# Patient Record
Sex: Female | Born: 1990 | Race: White | Hispanic: Yes | Marital: Married | Smoking: Never smoker
Health system: Southern US, Community
[De-identification: ages and names within clinical notes are randomized; demographics above are authoritative.]

## PROBLEM LIST (undated history)

## (undated) DIAGNOSIS — R112 Nausea with vomiting, unspecified: Secondary | ICD-10-CM

## (undated) HISTORY — DX: Nausea with vomiting, unspecified: R11.2

## (undated) HISTORY — PX: OTHER SURGICAL HISTORY: SHX169

---

## 2018-11-15 DIAGNOSIS — Z3481 Encounter for supervision of other normal pregnancy, first trimester: Secondary | ICD-10-CM | POA: Diagnosis not present

## 2018-11-15 DIAGNOSIS — Z1388 Encounter for screening for disorder due to exposure to contaminants: Secondary | ICD-10-CM | POA: Diagnosis not present

## 2018-11-15 DIAGNOSIS — Z3480 Encounter for supervision of other normal pregnancy, unspecified trimester: Secondary | ICD-10-CM | POA: Diagnosis not present

## 2018-11-15 DIAGNOSIS — Z3009 Encounter for other general counseling and advice on contraception: Secondary | ICD-10-CM | POA: Diagnosis not present

## 2018-11-15 DIAGNOSIS — Z0389 Encounter for observation for other suspected diseases and conditions ruled out: Secondary | ICD-10-CM | POA: Diagnosis not present

## 2018-11-16 LAB — OB RESULTS CONSOLE HIV ANTIBODY (ROUTINE TESTING): HIV: NONREACTIVE

## 2018-11-16 LAB — OB RESULTS CONSOLE ANTIBODY SCREEN: Antibody Screen: NEGATIVE

## 2018-11-16 LAB — OB RESULTS CONSOLE RUBELLA ANTIBODY, IGM: Rubella: IMMUNE

## 2018-11-16 LAB — OB RESULTS CONSOLE HGB/HCT, BLOOD
HCT: 37 (ref 29–41)
Hemoglobin: 12.3

## 2018-11-16 LAB — OB RESULTS CONSOLE ABO/RH
ABO/RH(D): A POS
RH Type: POSITIVE

## 2018-11-16 LAB — OB RESULTS CONSOLE GC/CHLAMYDIA
Chlamydia: NEGATIVE
Gonorrhea: NEGATIVE

## 2018-11-16 LAB — OB RESULTS CONSOLE RPR: RPR: NONREACTIVE

## 2018-11-16 LAB — SICKLE CELL SCREEN: Sickle Cell Screen: NORMAL

## 2018-11-16 LAB — OB RESULTS CONSOLE HEPATITIS B SURFACE ANTIGEN: Hepatitis B Surface Ag: NEGATIVE

## 2018-11-16 LAB — OB RESULTS CONSOLE PLATELET COUNT: Platelets: 351000

## 2018-11-16 LAB — OB RESULTS CONSOLE VARICELLA ZOSTER ANTIBODY, IGG: Varicella: IMMUNE

## 2018-11-18 DIAGNOSIS — Z3A12 12 weeks gestation of pregnancy: Secondary | ICD-10-CM | POA: Diagnosis not present

## 2018-11-18 DIAGNOSIS — O3680X0 Pregnancy with inconclusive fetal viability, not applicable or unspecified: Secondary | ICD-10-CM | POA: Diagnosis not present

## 2018-12-13 ENCOUNTER — Other Ambulatory Visit: Payer: Self-pay

## 2018-12-13 ENCOUNTER — Ambulatory Visit: Payer: Medicaid Other | Admitting: Nurse Practitioner

## 2018-12-13 DIAGNOSIS — O219 Vomiting of pregnancy, unspecified: Secondary | ICD-10-CM | POA: Insufficient documentation

## 2018-12-13 DIAGNOSIS — Z3482 Encounter for supervision of other normal pregnancy, second trimester: Secondary | ICD-10-CM

## 2018-12-13 DIAGNOSIS — Z6790 Unspecified blood type, Rh positive: Secondary | ICD-10-CM | POA: Insufficient documentation

## 2018-12-13 DIAGNOSIS — Z3480 Encounter for supervision of other normal pregnancy, unspecified trimester: Secondary | ICD-10-CM | POA: Insufficient documentation

## 2018-12-13 NOTE — Progress Notes (Signed)
Connected with patient by phone for scheduled Telehealth appt. Identified patient and verified current location as home address. Patient verbalized she is comfortable discussing health information by phone in current location. Taking PNV QD, denies ED/hospital visits since last RV. Explained optional Quad screen test today, patient declines Quad screen at this time. Patient aware of scheduled Anatomy ultrasound at [facility] for 01/06/2019 @ 3:15. Patient took BP with home BP cuff. Does not have scales at home to assess weight. Patient complaining of intermittent vaginal pressure. Denies burning, stinging, urgency or pain with urination. Reviewed with patient next appt will be in clinic. Will schedule patient for 4 week follow up appt in Maternity before provider completes call.Marland Kitchen

## 2018-12-13 NOTE — Progress Notes (Signed)
   TELEPHONE OBSTETRICS VISIT ENCOUNTER NOTE  I connected with@ on 12/13/18 at  1:35 PM EDT by telephone at home and verified that I am speaking with the correct person using two identifiers.   I discussed the limitations, risks, security and privacy concerns of performing an evaluation and management service by telephone and the availability of in person appointments. I also discussed with the patient that there may be a patient responsible charge related to this service. The patient expressed understanding and agreed to proceed.  Subjective:  Holly Hunter is a 28 y.o. G1P0 at Unknown being followed for ongoing prenatal care.  She is currently monitored for the following issues for this low-risk pregnancy and does not have a problem list on file.  Patient reports pelvic pressure, hot flashes and intermittent dizziness.  Client c/o intermittent pelvic pressure - plans for follow up in clinic if symptoms persists or worsen At night - hot flashes - advised of keeping sleeping area as cool as possible - noted symptoms my be related to adjusting hormone levels during pregnancy Dizziness - states that she had dizziness on 12/12/2018 for a few minutes and resolved - advised that during next visit BP would be monitored, advised to increase water intake to 8 glasses daily and plan to "look in client's ears" during next visit - possible cerumen impaction.  Reports fetal movement. Denies any contractions, bleeding or leaking of fluid.  Client verbalizes understanding and is in agreement with plan of care   The following portions of the patient's history were reviewed and updated as appropriate: allergies, current medications, past family history, past medical history, past social history, past surgical history and problem list.   Objective:   General:  Alert, oriented and cooperative.   Mental Status: Normal mood and affect perceived. Normal judgment and thought content.  Rest of physical exam  deferred due to type of encounter  Assessment and Plan:  Pregnancy: G1P0 at Unknown There are no diagnoses linked to this encounter. Preterm labor symptoms and general obstetric precautions including but not limited to vaginal bleeding, contractions, leaking of fluid and fetal movement were reviewed in detail with the patient.  I discussed the assessment and treatment plan with the patient. The patient was provided an opportunity to ask questions and all were answered. The patient agreed with the plan and demonstrated an understanding of the instructions. The patient was advised to call back or seek an in-person office evaluation/go to the hospital for any urgent or concerning symptoms.  Please refer to After Visit Summary for other counseling recommendations.   I provided 8 minutes of non-face-to-face time during this encounter.  No follow-ups on file.  No future appointments.  Berniece Andreas, NP

## 2019-01-06 DIAGNOSIS — Z36 Encounter for antenatal screening for chromosomal anomalies: Secondary | ICD-10-CM | POA: Diagnosis not present

## 2019-01-06 DIAGNOSIS — Z3A19 19 weeks gestation of pregnancy: Secondary | ICD-10-CM | POA: Diagnosis not present

## 2019-01-06 DIAGNOSIS — Z363 Encounter for antenatal screening for malformations: Secondary | ICD-10-CM | POA: Diagnosis not present

## 2019-01-10 ENCOUNTER — Ambulatory Visit: Payer: Medicaid Other | Admitting: Nurse Practitioner

## 2019-01-10 ENCOUNTER — Other Ambulatory Visit: Payer: Self-pay

## 2019-01-10 VITALS — BP 116/72 | Temp 97.4°F | Wt 136.4 lb

## 2019-01-10 DIAGNOSIS — Z3482 Encounter for supervision of other normal pregnancy, second trimester: Secondary | ICD-10-CM

## 2019-01-10 NOTE — Progress Notes (Signed)
   PRENATAL VISIT NOTE  Subjective:  Holly Hunter is a 28 y.o. G2P1001 at [redacted]w[redacted]d being seen today for ongoing prenatal care.  She is currently monitored for the following issues for this low-risk pregnancy and has Prenatal care, subsequent pregnancy, second trimester; Blood type, Rh positive; and Nausea and vomiting in pregnancy on their problem list.  Patient reports no complaints.  Contractions: Not present. Vag. Bleeding: None.  Movement: Present. Denies leaking of fluid/ROM.   The following portions of the patient's history were reviewed and updated as appropriate: allergies, current medications, past family history, past medical history, past social history, past surgical history and problem list. Problem list updated.  Objective:   Vitals:   01/10/19 1115  BP: 116/72  Temp: (!) 97.4 F (36.3 C)  Weight: 136 lb 6.4 oz (61.9 kg)    Fetal Status: Fetal Heart Rate (bpm): 150 Fundal Height: 20 cm Movement: Present     General:  Alert, oriented and cooperative. Patient is in no acute distress.  Skin: Skin is warm and dry. No rash noted.   Cardiovascular: Normal heart rate noted  Respiratory: Normal respiratory effort, no problems with respiration noted  Abdomen: Soft, gravid, appropriate for gestational age.  Pain/Pressure: Absent     Pelvic: Cervical exam deferred        Extremities: Normal range of motion.  Edema: None  Mental Status: Normal mood and affect. Normal behavior. Normal judgment and thought content.   Assessment and Plan:  Pregnancy: G2P1001 at [redacted]w[redacted]d  1. Prenatal care, subsequent pregnancy, second trimester Client doing well -  Denies any complaints at this time Discussed donation of placenta after delivery Discussed "growing pains" Daughter present     Preterm labor symptoms and general obstetric precautions including but not limited to vaginal bleeding, contractions, leaking of fluid and fetal movement were reviewed in detail with the patient. Please  refer to After Visit Summary for other counseling recommendations.  Return in about 4 weeks (around 02/07/2019) for routine prenatal care.  Future Appointments  Date Time Provider [place]  02/07/2019 10:20 AM [facility] PROVIDER AC[facility] None    Berniece Andreas, NP

## 2019-01-10 NOTE — Progress Notes (Signed)
Denies s/s or exposure to Covid-19. Taking PNV and [place] QD, denies ED/hospital visits since last RV. Kept 01/06/19 [facility] anatomy scan appt. Hal Morales, RN

## 2019-01-18 NOTE — Progress Notes (Signed)
Updated [place] information - no further action required at this time

## 2019-02-07 ENCOUNTER — Ambulatory Visit: Payer: Medicaid Other | Admitting: Advanced Practice Midwife

## 2019-02-07 ENCOUNTER — Other Ambulatory Visit: Payer: Self-pay

## 2019-02-07 ENCOUNTER — Encounter: Payer: Self-pay | Admitting: Advanced Practice Midwife

## 2019-02-07 ENCOUNTER — Ambulatory Visit: Payer: Medicaid Other

## 2019-02-07 DIAGNOSIS — Z3482 Encounter for supervision of other normal pregnancy, second trimester: Secondary | ICD-10-CM

## 2019-02-07 NOTE — Progress Notes (Signed)
   TELEPHONE OBSTETRICS VISIT ENCOUNTER NOTE  I connected with [name] @ on 02/07/19 at 10:20 AM EDT by telephone at home and verified that I am speaking with the correct person using two identifiers.   I discussed the limitations, risks, security and privacy concerns of performing an evaluation and management service by telephone and the availability of in person appointments. I also discussed with the patient that there may be a patient responsible charge related to this service. The patient expressed understanding and agreed to proceed.  Subjective:  Jocilyn Trego is a 28 y.o. G2P1001 at [redacted]w[redacted]d being followed for ongoing prenatal care.  She is currently monitored for the following issues for this low-risk pregnancy and has Prenatal care, subsequent pregnancy, second trimester; Blood type, Rh positive; and Nausea and vomiting in pregnancy on their problem list.  Patient reports heartburn. Reports fetal movement. Denies any contractions, bleeding or leaking of fluid.   The following portions of the patient's history were reviewed and updated as appropriate: allergies, current medications, past family history, past medical history, past social history, past surgical history and problem list.   Objective:   General:  Alert, oriented and cooperative.   Mental Status: Normal mood and affect perceived. Normal judgment and thought content.  Rest of physical exam deferred due to type of encounter  Assessment and Plan:  Pregnancy: G2P1001 at [redacted]w[redacted]d 1. Prenatal care, subsequent pregnancy, second trimester Feels well except occassional "heaviness" in lower abdomen and GERD with greasy burgers--suggestions given. Preterm labor symptoms and general obstetric precautions including but not limited to vaginal bleeding, contractions, leaking of fluid and fetal movement were reviewed in detail with the patient.  I discussed the assessment and treatment plan with the patient. The patient was  provided an opportunity to ask questions and all were answered. The patient agreed with the plan and demonstrated an understanding of the instructions. The patient was advised to call back or seek an in-person office evaluation/go to the hospital for any urgent or concerning symptoms.  Please refer to After Visit Summary for other counseling recommendations.   I provided 8 minutes of non-face-to-face time during this encounter.  Return for routine PNC and 28 wk labs.  Future Appointments  Date Time Provider [place]  03/07/2019  1:20 PM AC-MH PROVIDER AC-[facility] None    Herbie Saxon, CNM

## 2019-02-07 NOTE — Progress Notes (Signed)
Call fot [place] Telehealth visit; confirmed identity; BP recorded per client; discussed labs needed @ next visit; taking PNV Debera Lat, RN

## 2019-03-07 ENCOUNTER — Ambulatory Visit: Payer: Medicaid Other | Admitting: Advanced Practice Midwife

## 2019-03-07 ENCOUNTER — Other Ambulatory Visit: Payer: Self-pay

## 2019-03-07 VITALS — BP 114/70 | Temp 98.7°F | Wt 145.8 lb

## 2019-03-07 DIAGNOSIS — Z1388 Encounter for screening for disorder due to exposure to contaminants: Secondary | ICD-10-CM | POA: Diagnosis not present

## 2019-03-07 DIAGNOSIS — Z3480 Encounter for supervision of other normal pregnancy, unspecified trimester: Secondary | ICD-10-CM

## 2019-03-07 DIAGNOSIS — Z23 Encounter for immunization: Secondary | ICD-10-CM | POA: Diagnosis not present

## 2019-03-07 DIAGNOSIS — Z3009 Encounter for other general counseling and advice on contraception: Secondary | ICD-10-CM | POA: Diagnosis not present

## 2019-03-07 DIAGNOSIS — Z3482 Encounter for supervision of other normal pregnancy, second trimester: Secondary | ICD-10-CM | POA: Diagnosis not present

## 2019-03-07 DIAGNOSIS — Z0389 Encounter for observation for other suspected diseases and conditions ruled out: Secondary | ICD-10-CM | POA: Diagnosis not present

## 2019-03-07 DIAGNOSIS — Z6790 Unspecified blood type, Rh positive: Secondary | ICD-10-CM

## 2019-03-07 LAB — HEMOGLOBIN, FINGERSTICK: Hemoglobin: 11.1 g/dL (ref 11.1–15.9)

## 2019-03-07 LAB — HIV ANTIBODY (ROUTINE TESTING W REFLEX): HIV 1&2 Ab, 4th Generation: NONREACTIVE

## 2019-03-07 NOTE — Progress Notes (Signed)
Negative responses to Covid-19 screening questions and denies international travel for self or FOB since pregnant. Taking PNV QD. 28 week labs today. Verified has [facility] contact card. Rich Number, RN  Hgb = 11.1 and no interventions required per standing order.  Rich Number, RN

## 2019-03-07 NOTE — Progress Notes (Signed)
   PRENATAL VISIT NOTE  Subjective:  Holly Hunter is a 28 y.o. G2P1001 at [redacted]w[redacted]d being seen today for ongoing prenatal care.  She is currently monitored for the following issues for this low-risk pregnancy and has Prenatal care, subsequent pregnancy, second trimester; Blood type, Rh positive; and Nausea and vomiting in pregnancy on their problem list.  Patient reports sciatica.  Contractions: Not present. Vag. Bleeding: None.  Movement: Present. Denies leaking of fluid/ROM.   The following portions of the patient's history were reviewed and updated as appropriate: allergies, current medications, past family history, past medical history, past social history, past surgical history and problem list. Problem list updated.  Objective:   Vitals:   03/07/19 1312  BP: 114/70  Temp: 98.7 F (37.1 C)  Weight: 145 lb 12.8 oz (66.1 kg)    Fetal Status: Fetal Heart Rate (bpm): 144 Fundal Height: 29 cm Movement: Present  Presentation: Complete Breech  General:  Alert, oriented and cooperative. Patient is in no acute distress.  Skin: Skin is warm and dry. No rash noted.   Cardiovascular: Normal heart rate noted  Respiratory: Normal respiratory effort, no problems with respiration noted  Abdomen: Soft, gravid, appropriate for gestational age.  Pain/Pressure: Absent     Pelvic: Cervical exam deferred        Extremities: Normal range of motion.  Edema: None  Mental Status: Normal mood and affect. Normal behavior. Normal judgment and thought content.   Assessment and Plan:  Pregnancy: G2P1001 at [redacted]w[redacted]d   1. Blood type, Rh positive .  2. Supervision of other normal pregnancy, antepartum C/o right sciatica--suggestions given - HIV [place] [facility] - RPR - Glucose, 1 hour gestational - Tdap vaccine greater than or equal to 7yo IM - Flu Vaccine QUAD 36+ mos IM - Hemoglobin, venipuncture   Preterm labor symptoms and general obstetric precautions including but not limited to vaginal bleeding,  contractions, leaking of fluid and fetal movement were reviewed in detail with the patient. Please refer to After Visit Summary for other counseling recommendations.  No follow-ups on file.  No future appointments.  Herbie Saxon, CNM

## 2019-03-08 LAB — RPR: RPR Ser Ql: NONREACTIVE

## 2019-03-08 LAB — GLUCOSE, 1 HOUR GESTATIONAL: Gestational Diabetes Screen: 93 mg/dL (ref 65–139)

## 2019-03-21 ENCOUNTER — Other Ambulatory Visit: Payer: Self-pay

## 2019-03-21 ENCOUNTER — Ambulatory Visit: Payer: Medicaid Other | Admitting: Advanced Practice Midwife

## 2019-03-21 DIAGNOSIS — Z3482 Encounter for supervision of other normal pregnancy, second trimester: Secondary | ICD-10-CM

## 2019-03-21 NOTE — Progress Notes (Signed)
   TELEPHONE OBSTETRICS VISIT ENCOUNTER NOTE  I connected with Holly Hunter @ on 03/21/19 at  3:40 PM EDT by telephone at home and verified that I am speaking with the correct person using two identifiers.   I discussed the limitations, risks, security and privacy concerns of performing an evaluation and management service by telephone and the availability of in person appointments. I also discussed with the patient that there may be a patient responsible charge related to this service. The patient expressed understanding and agreed to proceed.  Subjective:  Holly Hunter is a 28 y.o. G2P1001 at [redacted]w[redacted]d being followed for ongoing prenatal care.  She is currently monitored for the following issues for this low-risk pregnancy and has Prenatal care, subsequent pregnancy, second trimester; Blood type, Rh positive; and Nausea and vomiting in pregnancy on their problem list.  Patient reports abdominal pressure. Reports fetal movement. Denies any contractions, bleeding or leaking of fluid.   The following portions of the patient's history were reviewed and updated as appropriate: allergies, current medications, past family history, past medical history, past social history, past surgical history and problem list.   Objective:   General:  Alert, oriented and cooperative.   Mental Status: Normal mood and affect perceived. Normal judgment and thought content.  Rest of physical exam deferred due to type of encounter  Assessment and Plan:  Pregnancy: G2P1001 at [redacted]w[redacted]d 1. Prenatal care, subsequent pregnancy, second trimester C/o low abdominal pressure at end of day--suggestions given Reviewed  11/18/18 u/s at 12 6/7 wks with posterior placenta and EDC=05/27/19  Preterm labor symptoms and general obstetric precautions including but not limited to vaginal bleeding, contractions, leaking of fluid and fetal movement were reviewed in detail with the patient.  I discussed the assessment and treatment plan  with the patient. The patient was provided an opportunity to ask questions and all were answered. The patient agreed with the plan and demonstrated an understanding of the instructions. The patient was advised to call back or seek an in-person office evaluation/go to the hospital for any urgent or concerning symptoms.  Please refer to After Visit Summary for other counseling recommendations.   I provided 7 minutes of non-face-to-face time during this encounter.  No follow-ups on file.  Future Appointments  Date Time Provider [place]  04/04/2019  3:20 PM [facility] PROVIDER [facility] None    Holly Hunter, [facility]

## 2019-03-21 NOTE — Progress Notes (Signed)
Phone call to patient for 30.3 week Telehealth appt. Identified patient and current location as home address. Patient states she is ok with discussing [place] over the phone at current location. Patient answers no to all Covid-19 questions. Taking PNV QD. Denies ED/hospital visits since last RV. BP taken with home BP cuff. Patient states she does no have scales to assess weight. 2 wk MH RV scheduled for 04/04/2019 @ 3:20. Instructed patient to arrive at 3:00 for check in also instructed patient to remain at this same contact number for provider to call back and complete provider portion of visit. Patient agreeable to same. Hal Morales, RN

## 2019-04-04 ENCOUNTER — Other Ambulatory Visit: Payer: Self-pay

## 2019-04-04 ENCOUNTER — Ambulatory Visit: Payer: Medicaid Other | Admitting: Advanced Practice Midwife

## 2019-04-04 DIAGNOSIS — Z3482 Encounter for supervision of other normal pregnancy, second trimester: Secondary | ICD-10-CM

## 2019-04-04 NOTE — Progress Notes (Signed)
   PRENATAL VISIT NOTE  Subjective:  Holly Hunter is a 28 y.o. G2P1001 at [redacted]w[redacted]d being seen today for ongoing prenatal care.  She is currently monitored for the following issues for this low-risk pregnancy and has Prenatal care, subsequent pregnancy, second trimester; Blood type, Rh positive; and Nausea and vomiting in pregnancy on their problem list.  Patient reports no complaints.  Contractions: Not present. Vag. Bleeding: None.  Movement: Present. Denies leaking of fluid/ROM.   The following portions of the patient's history were reviewed and updated as appropriate: allergies, current medications, past family history, past medical history, past social history, past surgical history and problem list. Problem list updated.  Objective:   Vitals:   04/04/19 1512  BP: 107/65  Temp: 98.3 F (36.8 C)  Weight: 151 lb 12.8 oz (68.9 kg)    Fetal Status: Fetal Heart Rate (bpm): 160 Fundal Height: 33 cm Movement: Present     General:  Alert, oriented and cooperative. Patient is in no acute distress.  Skin: Skin is warm and dry. No rash noted.   Cardiovascular: Normal heart rate noted  Respiratory: Normal respiratory effort, no problems with respiration noted  Abdomen: Soft, gravid, appropriate for gestational age.  Pain/Pressure: Absent     Pelvic: Cervical exam deferred        Extremities: Normal range of motion.  Edema: None  Mental Status: Normal mood and affect. Normal behavior. Normal judgment and thought content.   Assessment and Plan:  Pregnancy: G2P1001 at [redacted]w[redacted]d  1. Prenatal care, subsequent pregnancy, second trimester C/o vaginal pressure occasionally--suggestions given Has car seat.  Wants ocp's pp for birth control. Reviewed 01/06/19 u/s at 20 2/7 wks with post placenta, 3VC, normal anatomy   Preterm labor symptoms and general obstetric precautions including but not limited to vaginal bleeding, contractions, leaking of fluid and fetal movement were reviewed in detail with  the patient. Please refer to After Visit Summary for other counseling recommendations.  Return in about 2 weeks (around 04/18/2019) for routine PNC.  No future appointments.  Herbie Saxon, CNM

## 2019-04-04 NOTE — Progress Notes (Signed)
Patient here for MH RV at 32 3/7. Kick counts reviewed, and cards given..Waldine Zenz Brewer-Jensen, RN 

## 2019-04-18 ENCOUNTER — Other Ambulatory Visit: Payer: Self-pay

## 2019-04-18 ENCOUNTER — Ambulatory Visit: Payer: Medicaid Other | Admitting: Advanced Practice Midwife

## 2019-04-18 DIAGNOSIS — Z6790 Unspecified blood type, Rh positive: Secondary | ICD-10-CM

## 2019-04-18 DIAGNOSIS — Z3482 Encounter for supervision of other normal pregnancy, second trimester: Secondary | ICD-10-CM

## 2019-04-18 NOTE — Progress Notes (Addendum)
Patient here for MH RV at 34 3/7. Patient has some questions about starting with breastfeeding and switching to formula when she returns to work .Jenetta Downer, RN

## 2019-04-18 NOTE — Progress Notes (Signed)
   PRENATAL VISIT NOTE  Subjective:  Holly Hunter is a 28 y.o. G2P1001 at [redacted]w[redacted]d being seen today for ongoing prenatal care.  She is currently monitored for the following issues for this low-risk pregnancy and has Prenatal care, subsequent pregnancy, second trimester; Blood type, Rh positive; and Nausea and vomiting in pregnancy on their problem list.  Patient reports no complaints.  Contractions: Not present. Vag. Bleeding: None.  Movement: Present. Denies leaking of fluid/ROM.   The following portions of the patient's history were reviewed and updated as appropriate: allergies, current medications, past family history, past medical history, past social history, past surgical history and problem list. Problem list updated.  Objective:   Vitals:   04/18/19 1500  BP: 101/62  Temp: 98.6 F (37 C)  Weight: 153 lb 6.4 oz (69.6 kg)    Fetal Status: Fetal Heart Rate (bpm): 140 Fundal Height: 35 cm Movement: Present     General:  Alert, oriented and cooperative. Patient is in no acute distress.  Skin: Skin is warm and dry. No rash noted.   Cardiovascular: Normal heart rate noted  Respiratory: Normal respiratory effort, no problems with respiration noted  Abdomen: Soft, gravid, appropriate for gestational age.  Pain/Pressure: Absent     Pelvic: Cervical exam deferred        Extremities: Normal range of motion.  Edema: None  Mental Status: Normal mood and affect. Normal behavior. Normal judgment and thought content.   Assessment and Plan:  Pregnancy: G2P1001 at [redacted]w[redacted]d  1. Prenatal care, subsequent pregnancy, second trimester Stopped working in May or June and is on maternity leave.  Breastfeeding questions answered as well as transitioning to work and pumping.   Preterm labor symptoms and general obstetric precautions including but not limited to vaginal bleeding, contractions, leaking of fluid and fetal movement were reviewed in detail with the patient. Please refer to After Visit  Summary for other counseling recommendations.  No follow-ups on file.  Future Appointments  Date Time Provider [place]  05/02/2019  3:00 PM [facility] PROVIDER [facility] None    Herbie Saxon, CNM

## 2019-05-02 ENCOUNTER — Ambulatory Visit: Payer: Medicaid Other | Admitting: Advanced Practice Midwife

## 2019-05-02 ENCOUNTER — Other Ambulatory Visit: Payer: Self-pay

## 2019-05-02 DIAGNOSIS — Z3482 Encounter for supervision of other normal pregnancy, second trimester: Secondary | ICD-10-CM | POA: Diagnosis not present

## 2019-05-02 NOTE — Progress Notes (Signed)
   PRENATAL VISIT NOTE  Subjective:  Holly Hunter is a 28 y.o. G2P1001 at [redacted]w[redacted]d being seen today for ongoing prenatal care.  She is currently monitored for the following issues for this low-risk pregnancy and has Prenatal care, subsequent pregnancy, second trimester; Blood type, Rh positive; and Nausea and vomiting in pregnancy on their problem list.  Patient reports increased vaginal pressure at night.  Contractions: Not present. Vag. Bleeding: None.  Movement: Present. Denies leaking of fluid/ROM.   The following portions of the patient's history were reviewed and updated as appropriate: allergies, current medications, past family history, past medical history, past social history, past surgical history and problem list. Problem list updated.  Objective:   Vitals:   05/02/19 1459  BP: 102/72  Temp: 97.7 F (36.5 C)  Weight: 155 lb (70.3 kg)    Fetal Status: Fetal Heart Rate (bpm): 160 Fundal Height: 35 cm Movement: Present  Presentation: Vertex  General:  Alert, oriented and cooperative. Patient is in no acute distress.  Skin: Skin is warm and dry. No rash noted.   Cardiovascular: Normal heart rate noted  Respiratory: Normal respiratory effort, no problems with respiration noted  Abdomen: Soft, gravid, appropriate for gestational age.  Pain/Pressure: Absent     Pelvic: Cervical exam deferred        Extremities: Normal range of motion.  Edema: None  Mental Status: Normal mood and affect. Normal behavior. Normal judgment and thought content.   Assessment and Plan:  Pregnancy: G2P1001 at [redacted]w[redacted]d  1. Prenatal care, subsequent pregnancy, second trimester Knows when to go to L&D.  Ready for baby at home.  On maternity leave.  GC/Chlamydia/GBS cultures done   Preterm labor symptoms and general obstetric precautions including but not limited to vaginal bleeding, contractions, leaking of fluid and fetal movement were reviewed in detail with the patient. Please refer to After Visit  Summary for other counseling recommendations.  No follow-ups on file.  No future appointments.  Herbie Saxon, CNM

## 2019-05-05 LAB — CHLAMYDIA/GC NAA, CONFIRMATION
Chlamydia trachomatis, NAA: NEGATIVE
Neisseria gonorrhoeae, NAA: NEGATIVE

## 2019-05-06 LAB — CULTURE, BETA STREP (GROUP B ONLY): Strep Gp B Culture: NEGATIVE

## 2019-05-09 ENCOUNTER — Ambulatory Visit: Payer: Medicaid Other | Admitting: Advanced Practice Midwife

## 2019-05-09 ENCOUNTER — Other Ambulatory Visit: Payer: Self-pay

## 2019-05-09 DIAGNOSIS — Z3482 Encounter for supervision of other normal pregnancy, second trimester: Secondary | ICD-10-CM

## 2019-05-09 NOTE — Progress Notes (Signed)
   PRENATAL VISIT NOTE  Subjective:  Holly Hunter is a 28 y.o. G2P1001 at [redacted]w[redacted]d being seen today for ongoing prenatal care.  She is currently monitored for the following issues for this low-risk pregnancy and has Prenatal care, subsequent pregnancy, second trimester; Blood type, Rh positive; and Nausea and vomiting in pregnancy on their problem list.  Patient reports no complaints.  Contractions: Not present. Vag. Bleeding: None.  Movement: Present.  Denies leaking of fluid/ROM.   The following portions of the patient's history were reviewed and updated as appropriate: allergies, current medications, past family history, past medical history, past social history, past surgical history and problem list. Problem list updated.  Objective:   Vitals:   05/09/19 1020  BP: 114/75  Temp: (!) 97.4 F (36.3 C)  Weight: 156 lb (70.8 kg)    Fetal Status: Fetal Heart Rate (bpm): 130 Fundal Height: 37 cm Movement: Present  Presentation: Vertex  General:  Alert, oriented and cooperative. Patient is in no acute distress.  Skin: Skin is warm and dry. No rash noted.   Cardiovascular: Normal heart rate noted  Respiratory: Normal respiratory effort, no problems with respiration noted  Abdomen: Soft, gravid, appropriate for gestational age.  Pain/Pressure: Absent     Pelvic: Cervical exam deferred        Extremities: Normal range of motion.  Edema: None  Mental Status: Normal mood and affect. Normal behavior. Normal judgment and thought content.   Assessment and Plan:  Pregnancy: G2P1001 at [redacted]w[redacted]d  1. Prenatal care, subsequent pregnancy, second trimester Feels well.  Not working.  Ready for baby at home.  Knows when to go to L&D   Preterm labor symptoms and general obstetric precautions including but not limited to vaginal bleeding, contractions, leaking of fluid and fetal movement were reviewed in detail with the patient. Please refer to After Visit Summary for other counseling  recommendations.  Return in about 1 week (around 05/16/2019) for routine PNC.  No future appointments.  Herbie Saxon, CNM

## 2019-05-09 NOTE — Progress Notes (Signed)
Here today for 37.3 week MH RV.   Taking PNV QD, denies ED/hospital visits since last RV. Semir Brill, RN   

## 2019-05-17 ENCOUNTER — Other Ambulatory Visit: Payer: Self-pay

## 2019-05-17 ENCOUNTER — Ambulatory Visit: Payer: Medicaid Other | Admitting: Advanced Practice Midwife

## 2019-05-17 DIAGNOSIS — Z3483 Encounter for supervision of other normal pregnancy, third trimester: Secondary | ICD-10-CM

## 2019-05-17 NOTE — Progress Notes (Signed)
Here today for 38.4 week MH RV. Taking PNV QD, denies ED/hospital visits since last RV. Hal Morales, RN

## 2019-05-17 NOTE — Progress Notes (Signed)
   PRENATAL VISIT NOTE  Subjective:  Holly Hunter is a 28 y.o. G2P1001 at [redacted]w[redacted]d being seen today for ongoing prenatal care.  She is currently monitored for the following issues for this low-risk pregnancy and has Prenatal care, subsequent pregnancy, second trimester; Blood type, Rh positive; Nausea and vomiting in pregnancy; and Prenatal care, subsequent pregnancy, third trimester on their problem list.  Patient reports no complaints.  Contractions: Not present. Vag. Bleeding: None.  Movement: Present. Denies leaking of fluid/ROM.   The following portions of the patient's history were reviewed and updated as appropriate: allergies, current medications, past family history, past medical history, past social history, past surgical history and problem list. Problem list updated.  Objective:   Vitals:   05/17/19 1006  BP: 111/70  Temp: (!) 97.3 F (36.3 C)  Weight: 158 lb 3.2 oz (71.8 kg)    Fetal Status: Fetal Heart Rate (bpm): 150 Fundal Height: 38 cm Movement: Present  Presentation: Vertex  General:  Alert, oriented and cooperative. Patient is in no acute distress.  Skin: Skin is warm and dry. No rash noted.   Cardiovascular: Normal heart rate noted  Respiratory: Normal respiratory effort, no problems with respiration noted  Abdomen: Soft, gravid, appropriate for gestational age.  Pain/Pressure: Absent     Pelvic: Cervical exam deferred        Extremities: Normal range of motion.  Edema: None  Mental Status: Normal mood and affect. Normal behavior. Normal judgment and thought content.   Assessment and Plan:  Pregnancy: G2P1001 at [redacted]w[redacted]d  1. Prenatal care, subsequent pregnancy, third trimester Feels well.  Ready for baby at home.  Knows when to go to L&D.  Has car seat.  EFW=7#   Term labor symptoms and general obstetric precautions including but not limited to vaginal bleeding, contractions, leaking of fluid and fetal movement were reviewed in detail with the patient. Please  refer to After Visit Summary for other counseling recommendations.  Return in about 1 week (around 05/24/2019) for routine PNC.  No future appointments.  Herbie Saxon, CNM

## 2019-05-19 ENCOUNTER — Encounter: Payer: Self-pay | Admitting: Family Medicine

## 2019-05-24 ENCOUNTER — Ambulatory Visit: Payer: Medicaid Other | Admitting: Advanced Practice Midwife

## 2019-05-24 ENCOUNTER — Other Ambulatory Visit: Payer: Self-pay

## 2019-05-24 DIAGNOSIS — Z3480 Encounter for supervision of other normal pregnancy, unspecified trimester: Secondary | ICD-10-CM

## 2019-05-24 DIAGNOSIS — Z6790 Unspecified blood type, Rh positive: Secondary | ICD-10-CM

## 2019-05-24 NOTE — Progress Notes (Signed)
Patient here for MH RV at 39 4/7. .Jaizon Deroos Brewer-Jensen, RN  

## 2019-05-24 NOTE — Progress Notes (Signed)
   PRENATAL VISIT NOTE  Subjective:  Holly Hunter is a 28 y.o. G2P1001 at [redacted]w[redacted]d being seen today for ongoing prenatal care.  She is currently monitored for the following issues for this low-risk pregnancy and has Supervision of other normal pregnancy, antepartum; Blood type, Rh positive; and Nausea and vomiting in pregnancy on their problem list.  Patient reports no complaints.  Contractions: Not present. Vag. Bleeding: None.  Movement: Present. Denies leaking of fluid/ROM.   The following portions of the patient's history were reviewed and updated as appropriate: allergies, current medications, past family history, past medical history, past social history, past surgical history and problem list. Problem list updated.  Objective:   Vitals:   05/24/19 1000  BP: 110/75  Pulse: 95  Temp: (!) 97.1 F (36.2 C)  Weight: 158 lb 9.6 oz (71.9 kg)    Fetal Status: Fetal Heart Rate (bpm): 130 Fundal Height: 39 cm Movement: Present  Presentation: Vertex  General:  Alert, oriented and cooperative. Patient is in no acute distress.  Skin: Skin is warm and dry. No rash noted.   Cardiovascular: Normal heart rate noted  Respiratory: Normal respiratory effort, no problems with respiration noted  Abdomen: Soft, gravid, appropriate for gestational age.  Pain/Pressure: Absent     Pelvic: Cervical exam performed Dilation: 3 Effacement (%): 80 Station: -2  Extremities: Normal range of motion.  Edema: None  Mental Status: Normal mood and affect. Normal behavior. Normal judgment and thought content.   Assessment and Plan:  Pregnancy: G2P1001 at [redacted]w[redacted]d  1. Supervision of other normal pregnancy, antepartum Feels well.  Ready for baby Holly Hunter at home.  Knows when to go to L&D. Desires IOL 06/03/19--paperwork completed   Term labor symptoms and general obstetric precautions including but not limited to vaginal bleeding, contractions, leaking of fluid and fetal movement were reviewed in detail with the  patient. Please refer to After Visit Summary for other counseling recommendations.  No follow-ups on file.  No future appointments.  Herbie Saxon, CNM

## 2019-05-24 NOTE — Progress Notes (Signed)
[facility] IOL referral faxed with confirmation

## 2019-05-25 DIAGNOSIS — Z3480 Encounter for supervision of other normal pregnancy, unspecified trimester: Secondary | ICD-10-CM | POA: Diagnosis not present

## 2019-05-25 DIAGNOSIS — Z3A39 39 weeks gestation of pregnancy: Secondary | ICD-10-CM | POA: Diagnosis not present

## 2019-05-31 ENCOUNTER — Ambulatory Visit: Payer: Medicaid Other

## 2019-08-02 DIAGNOSIS — Z20828 Contact with and (suspected) exposure to other viral communicable diseases: Secondary | ICD-10-CM | POA: Diagnosis not present

## 2019-08-03 ENCOUNTER — Telehealth: Payer: Self-pay

## 2019-08-03 NOTE — Telephone Encounter (Signed)
Attempted call to schedule PP appt; no answer, left voicemail message with appt# Sharlette Dense, RN

## 2019-09-20 DIAGNOSIS — Z20828 Contact with and (suspected) exposure to other viral communicable diseases: Secondary | ICD-10-CM | POA: Diagnosis not present

## 2019-09-21 DIAGNOSIS — Z20828 Contact with and (suspected) exposure to other viral communicable diseases: Secondary | ICD-10-CM | POA: Diagnosis not present

## 2019-10-20 DIAGNOSIS — Z20828 Contact with and (suspected) exposure to other viral communicable diseases: Secondary | ICD-10-CM | POA: Diagnosis not present

## 2019-11-09 DIAGNOSIS — Z20828 Contact with and (suspected) exposure to other viral communicable diseases: Secondary | ICD-10-CM | POA: Diagnosis not present

## 2019-12-19 DIAGNOSIS — H5213 Myopia, bilateral: Secondary | ICD-10-CM | POA: Diagnosis not present

## 2020-03-14 DIAGNOSIS — Z3482 Encounter for supervision of other normal pregnancy, second trimester: Secondary | ICD-10-CM | POA: Diagnosis not present

## 2020-03-14 DIAGNOSIS — Z3200 Encounter for pregnancy test, result unknown: Secondary | ICD-10-CM | POA: Diagnosis not present

## 2020-03-14 DIAGNOSIS — Z23 Encounter for immunization: Secondary | ICD-10-CM | POA: Diagnosis not present

## 2020-03-14 DIAGNOSIS — Z1389 Encounter for screening for other disorder: Secondary | ICD-10-CM | POA: Diagnosis not present

## 2020-03-20 ENCOUNTER — Other Ambulatory Visit: Payer: Self-pay | Admitting: Family Medicine

## 2020-03-20 ENCOUNTER — Other Ambulatory Visit: Payer: Self-pay

## 2020-03-20 DIAGNOSIS — Z3480 Encounter for supervision of other normal pregnancy, unspecified trimester: Secondary | ICD-10-CM

## 2020-03-20 DIAGNOSIS — Z3482 Encounter for supervision of other normal pregnancy, second trimester: Secondary | ICD-10-CM

## 2020-03-28 DIAGNOSIS — Z3482 Encounter for supervision of other normal pregnancy, second trimester: Secondary | ICD-10-CM | POA: Diagnosis not present

## 2020-04-01 ENCOUNTER — Ambulatory Visit: Payer: Medicaid Other

## 2020-04-04 ENCOUNTER — Other Ambulatory Visit: Payer: Self-pay | Admitting: Family Medicine

## 2020-04-16 ENCOUNTER — Ambulatory Visit: Payer: Medicaid Other | Attending: Family Medicine

## 2020-04-16 ENCOUNTER — Ambulatory Visit
Admission: RE | Admit: 2020-04-16 | Discharge: 2020-04-16 | Disposition: A | Discharge status: Home / Self Care | Payer: Medicaid Other | Source: Ambulatory Visit | Attending: Family Medicine | Admitting: Family Medicine

## 2020-04-16 ENCOUNTER — Other Ambulatory Visit: Payer: Self-pay

## 2020-04-16 ENCOUNTER — Other Ambulatory Visit: Payer: Self-pay | Admitting: Family Medicine

## 2020-04-16 DIAGNOSIS — Z3482 Encounter for supervision of other normal pregnancy, second trimester: Secondary | ICD-10-CM

## 2020-04-16 DIAGNOSIS — Z3689 Encounter for other specified antenatal screening: Secondary | ICD-10-CM

## 2020-04-16 DIAGNOSIS — Z3480 Encounter for supervision of other normal pregnancy, unspecified trimester: Secondary | ICD-10-CM

## 2020-04-17 ENCOUNTER — Other Ambulatory Visit: Payer: Self-pay | Admitting: *Deleted

## 2020-04-17 DIAGNOSIS — Z362 Encounter for other antenatal screening follow-up: Secondary | ICD-10-CM

## 2020-04-25 DIAGNOSIS — Z3482 Encounter for supervision of other normal pregnancy, second trimester: Secondary | ICD-10-CM | POA: Diagnosis not present

## 2020-05-08 DIAGNOSIS — Z315 Encounter for genetic counseling: Secondary | ICD-10-CM | POA: Diagnosis not present

## 2020-05-08 DIAGNOSIS — Z363 Encounter for antenatal screening for malformations: Secondary | ICD-10-CM | POA: Diagnosis not present

## 2020-05-08 DIAGNOSIS — O283 Abnormal ultrasonic finding on antenatal screening of mother: Secondary | ICD-10-CM | POA: Diagnosis not present

## 2020-05-08 DIAGNOSIS — Z3A21 21 weeks gestation of pregnancy: Secondary | ICD-10-CM | POA: Diagnosis not present

## 2020-05-15 ENCOUNTER — Ambulatory Visit: Payer: Medicaid Other

## 2020-05-23 DIAGNOSIS — Z3482 Encounter for supervision of other normal pregnancy, second trimester: Secondary | ICD-10-CM | POA: Diagnosis not present

## 2020-06-20 DIAGNOSIS — Z3482 Encounter for supervision of other normal pregnancy, second trimester: Secondary | ICD-10-CM | POA: Diagnosis not present

## 2020-06-20 DIAGNOSIS — Z23 Encounter for immunization: Secondary | ICD-10-CM | POA: Diagnosis not present

## 2020-07-23 DIAGNOSIS — Z3483 Encounter for supervision of other normal pregnancy, third trimester: Secondary | ICD-10-CM | POA: Diagnosis not present

## 2020-08-06 DIAGNOSIS — Z1389 Encounter for screening for other disorder: Secondary | ICD-10-CM | POA: Diagnosis not present

## 2020-08-06 DIAGNOSIS — Z3483 Encounter for supervision of other normal pregnancy, third trimester: Secondary | ICD-10-CM | POA: Diagnosis not present

## 2020-08-20 DIAGNOSIS — Z3483 Encounter for supervision of other normal pregnancy, third trimester: Secondary | ICD-10-CM | POA: Diagnosis not present

## 2020-08-30 DIAGNOSIS — Z3483 Encounter for supervision of other normal pregnancy, third trimester: Secondary | ICD-10-CM | POA: Diagnosis not present

## 2020-09-07 DIAGNOSIS — O9902 Anemia complicating childbirth: Secondary | ICD-10-CM | POA: Diagnosis not present

## 2020-09-07 DIAGNOSIS — Z20822 Contact with and (suspected) exposure to covid-19: Secondary | ICD-10-CM | POA: Diagnosis not present

## 2020-09-07 DIAGNOSIS — Z3A39 39 weeks gestation of pregnancy: Secondary | ICD-10-CM | POA: Diagnosis not present

## 2020-09-08 DIAGNOSIS — O9903 Anemia complicating the puerperium: Secondary | ICD-10-CM | POA: Diagnosis not present

## 2020-09-08 DIAGNOSIS — D649 Anemia, unspecified: Secondary | ICD-10-CM | POA: Diagnosis not present

## 2020-10-09 DIAGNOSIS — Z1389 Encounter for screening for other disorder: Secondary | ICD-10-CM | POA: Diagnosis not present

## 2020-10-09 DIAGNOSIS — Z130 Encounter for screening for diseases of the blood and blood-forming organs and certain disorders involving the immune mechanism: Secondary | ICD-10-CM | POA: Diagnosis not present

## 2020-10-30 DIAGNOSIS — Z309 Encounter for contraceptive management, unspecified: Secondary | ICD-10-CM | POA: Diagnosis not present

## 2020-10-30 DIAGNOSIS — Z3049 Encounter for surveillance of other contraceptives: Secondary | ICD-10-CM | POA: Diagnosis not present

## 2021-05-01 IMAGING — US US MFM OB DETAIL+14 WK
1 series · 13 of 28 positions shown · non-contrast
Comparison: none

[Series 1: us mfm ob detail+14 wk · 13 of 109 slices shown]
[im 5/109]
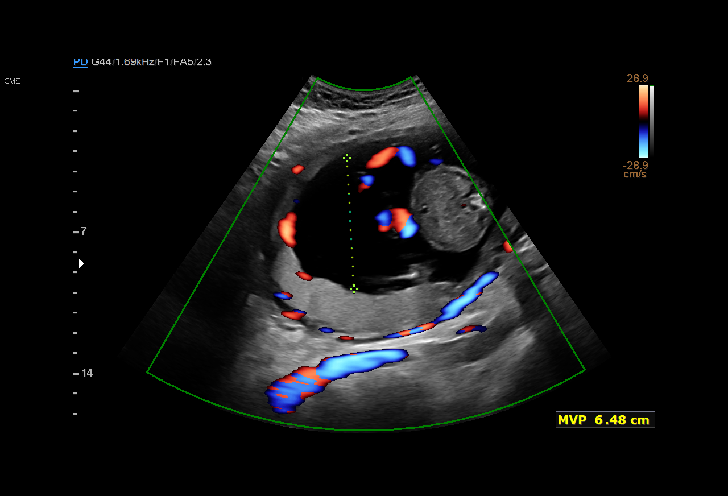
[im 13/109]
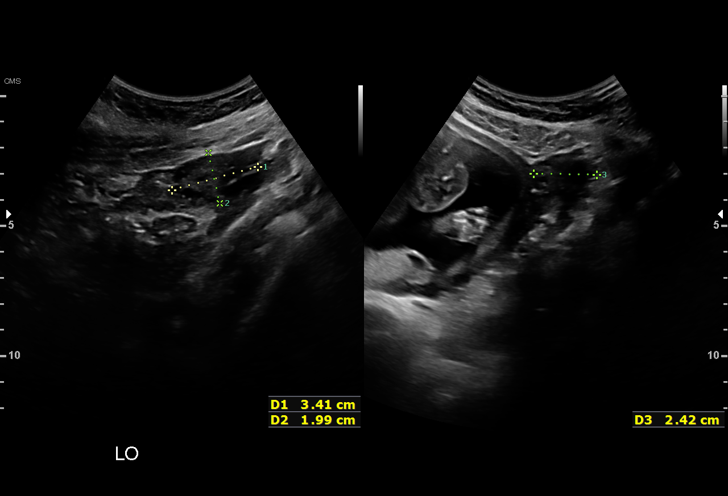
[im 21/109]
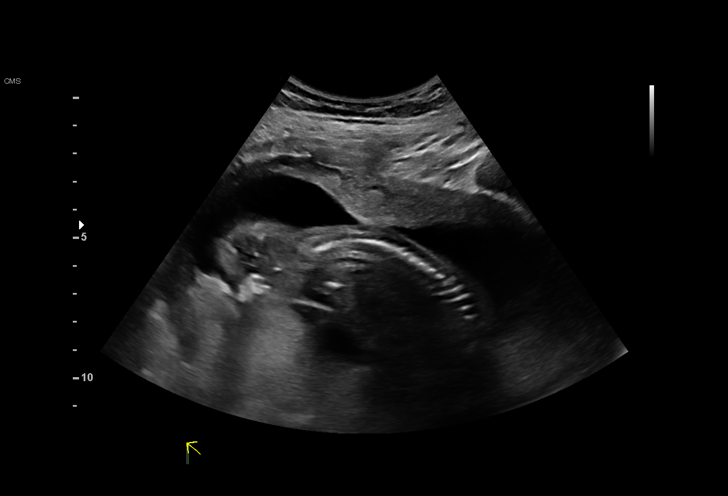
[im 29/109]
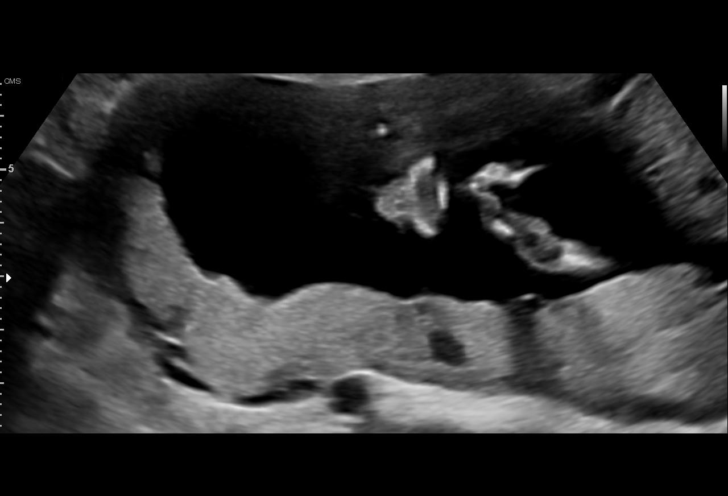
[im 37/109]
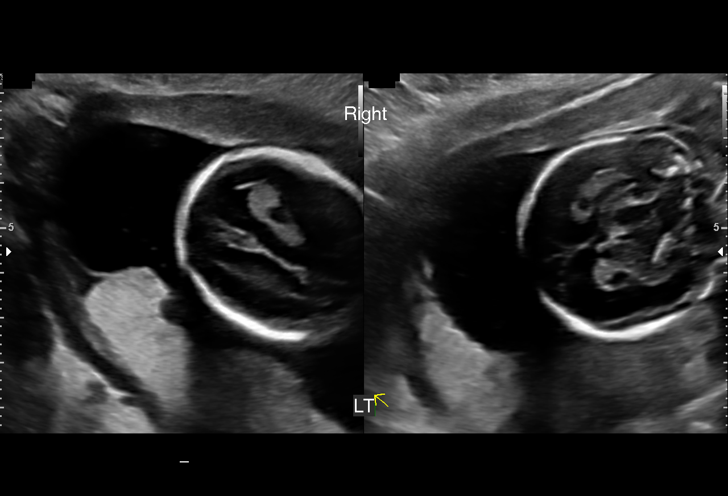
[im 45/109]
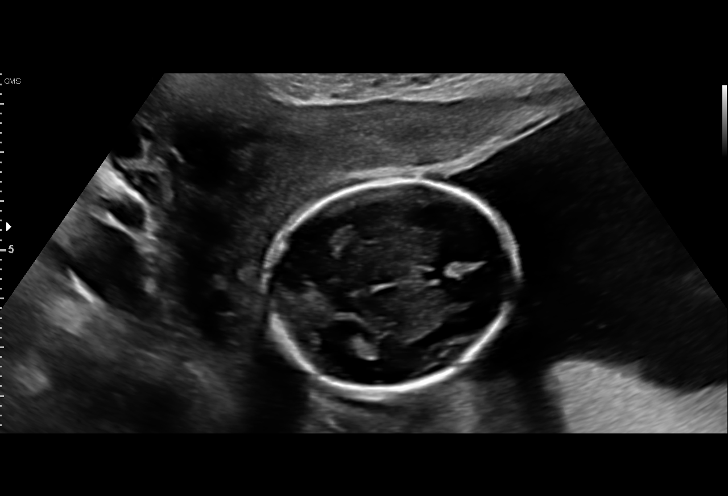
[im 57/109]
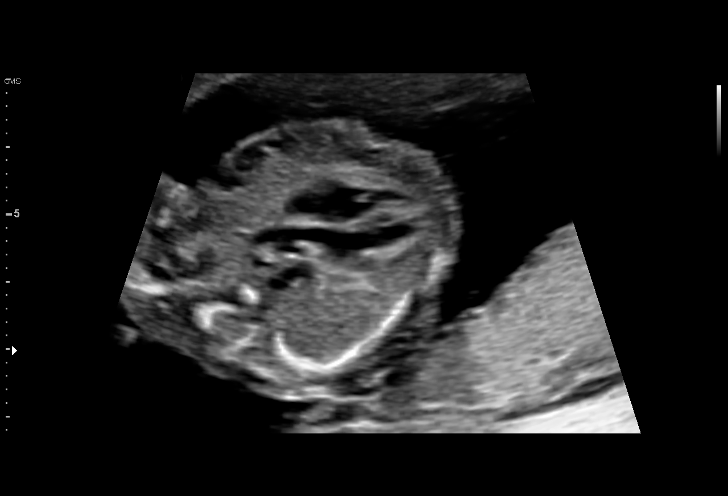
[im 65/109]
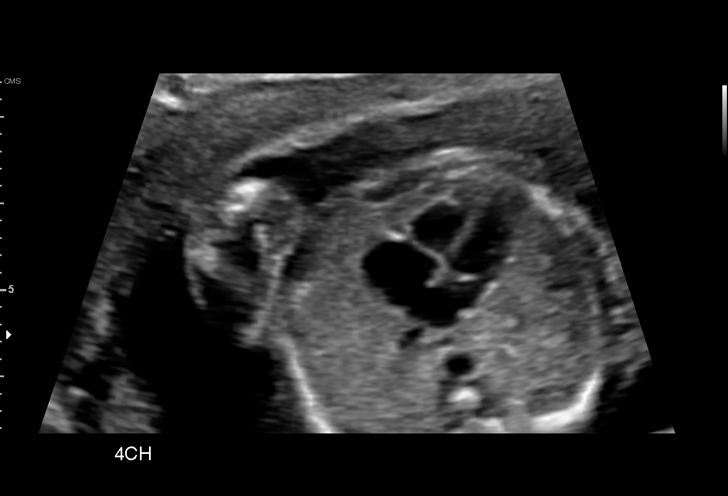
[im 73/109]
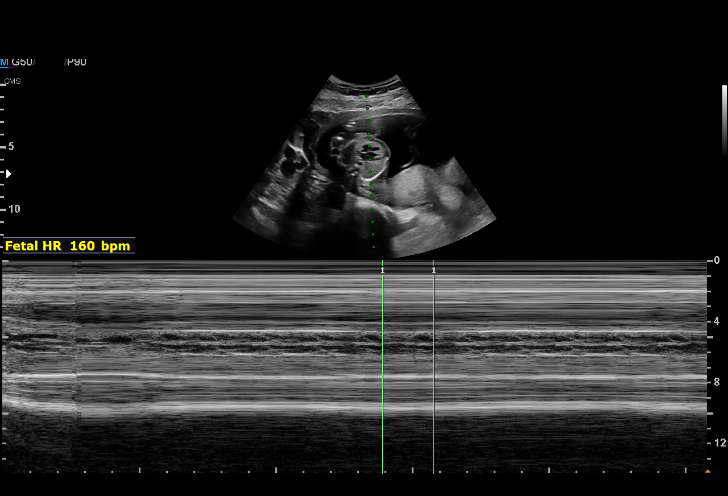
[im 81/109]
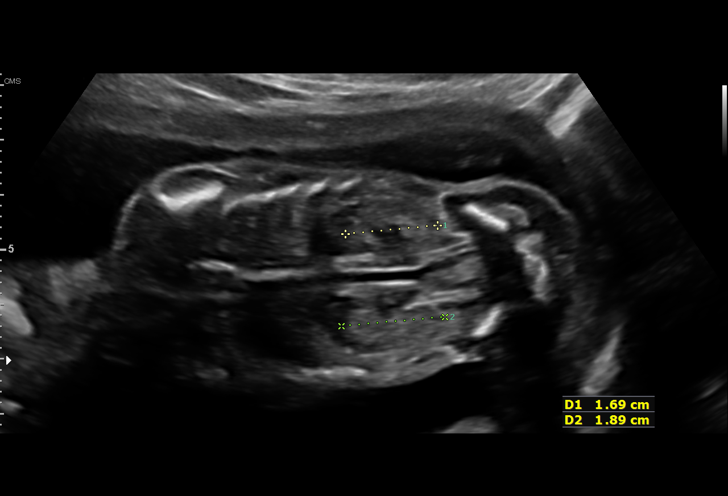
[im 89/109]
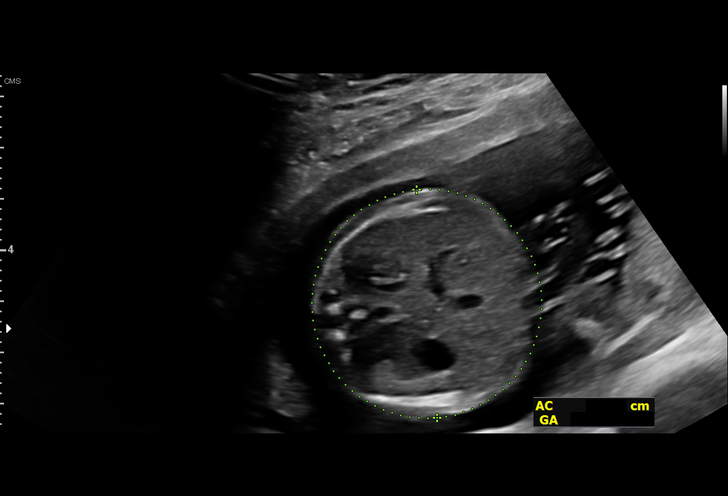
[im 97/109]
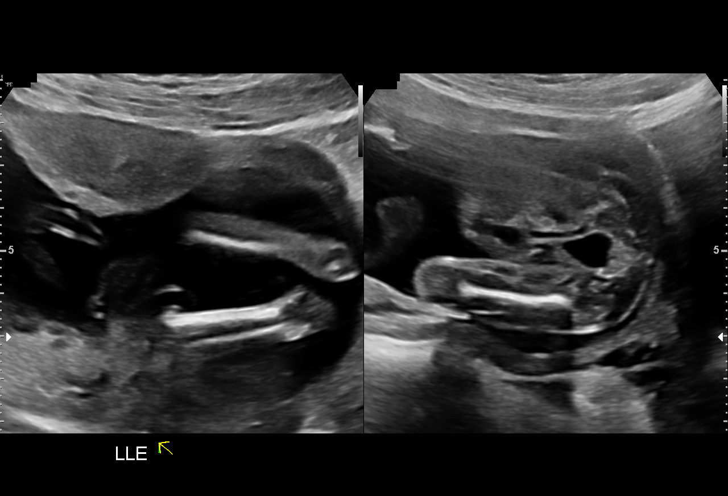
[im 105/109]
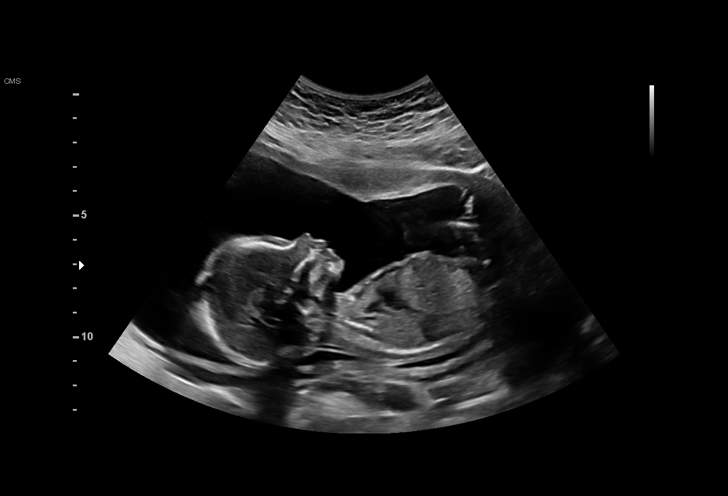

[13 of 28 positions shown; findings below may reference images not displayed]

Community [REDACTED]
                   FNP

Indications

 18 weeks gestation of pregnancy
 Encounter for uncertain dates
 Antenatal screening for malformations
 Fetal choroid plexus cyst (right)
Fetal Evaluation

 Num Of Fetuses:         1
 Fetal Heart Rate(bpm):  160
 Cardiac Activity:       Observed
 Presentation:           Variable
 Placenta:               Posterior
 P. Cord Insertion:      Visualized

 Amniotic Fluid
 AFI FV:      Within normal limits

                             Largest Pocket(cm)

Biometry

 BPD:      41.5  mm     G. Age:  18w 4d         44  %    CI:         73.5   %    70 - 86
                                                         FL/HC:      18.7   %    16.1 -
 HC:      153.8  mm     G. Age:  18w 2d         25  %    HC/AC:      1.10        1.09 -
 AC:      139.5  mm     G. Age:  19w 2d         68  %    FL/BPD:     69.4   %
 FL:       28.8  mm     G. Age:  18w 6d         49  %    FL/AC:      20.6   %    20 - 24
 HUM:      27.9  mm     G. Age:  19w 0d         59  %
 CER:      18.9  mm     G. Age:  18w 4d         32  %
 NFT:       3.7  mm

 LV:        5.7  mm
 CM:        3.4  mm

 Est. FW:     270  gm    0 lb 10 oz      65  %
OB History

 Gravidity:    3         Term:   2        Prem:   0        SAB:   0
 TOP:          0       Ectopic:  0        Living: 2
Gestational Age

 U/S Today:     18w 5d                                        EDD:   09/12/20
 Best:          18w 5d     Det. By:  U/S (04/16/20)           EDD:   09/12/20
Anatomy

 Cranium:               Appears normal         LVOT:                   Appears normal
 Cavum:                 Appears normal         Aortic Arch:            Appears normal
 Ventricles:            Appears normal         Ductal Arch:            Appears normal
 Choroid Plexus:        Appears normal         Diaphragm:              Appears normal
 Cerebellum:            Appears normal         Stomach:                Appears normal, left
                                                                       sided
 Posterior Fossa:       Appears normal         Abdomen:                Appears normal
 Nuchal Fold:           Appears normal         Abdominal Wall:         Appears nml (cord
                                                                       insert, abd wall)
 Face:                  Appears normal         Cord Vessels:           Appears normal (3
                        (orbits and profile)                           vessel cord)
 Lips:                  Appears normal         Kidneys:                Appear normal
 Palate:                Appears normal         Bladder:                Appears normal
 Thoracic:              Appears normal         Spine:                  Appears normal
 Heart:                 Appears normal         Upper Extremities:      Appears normal
                        (4CH, axis, and
                        situs)
 RVOT:                  Appears normal         Lower Extremities:      Appears normal

 Other:  Fetus appears to be female. Heels/feet and left open hands/5th digits
         visualized. Nasal bone visualized. VC and 3VV visualized.
Cervix Uterus Adnexa

 Cervix
 Length:           3.29  cm.
 Normal appearance by transabdominal scan.

 Uterus
 No abnormality visualized.

 Right Ovary
 Within normal limits.

 Left Ovary
 Within normal limits.
 Cul De Sac
 No free fluid seen.

 Adnexa
 No abnormality visualized.
Impression

 Single intrauterine pregnancy with measurements with
 today's ultrasound. Therefore Ms. Gili is dated by
 today's examination.
 Normal anatomy was seen with good fetal movement and
 amniotic fluid volume.
 An isolated unilateral right choroid plexus cyst was observed.
 No additional markers of aneuploidy were seen.
 We discussed the benign nature of this finding and that no
 increased risk are observed given that it isolated. However,
 Ms. Lurika mentioned she would discuss at your offices
 and consider NIPS then or when she returns for follow up
 growth.
Recommendations

 Follow up growth in 4 weeks to confirm dates.

## 2022-06-18 DIAGNOSIS — H5213 Myopia, bilateral: Secondary | ICD-10-CM | POA: Diagnosis not present

## 2022-06-23 DIAGNOSIS — Z1389 Encounter for screening for other disorder: Secondary | ICD-10-CM | POA: Diagnosis not present

## 2022-06-23 DIAGNOSIS — Z1331 Encounter for screening for depression: Secondary | ICD-10-CM | POA: Diagnosis not present

## 2022-06-23 DIAGNOSIS — M25561 Pain in right knee: Secondary | ICD-10-CM | POA: Diagnosis not present

## 2022-06-23 DIAGNOSIS — B351 Tinea unguium: Secondary | ICD-10-CM | POA: Diagnosis not present

## 2023-03-30 DIAGNOSIS — F4322 Adjustment disorder with anxiety: Secondary | ICD-10-CM | POA: Diagnosis not present

## 2023-04-06 DIAGNOSIS — F4322 Adjustment disorder with anxiety: Secondary | ICD-10-CM | POA: Diagnosis not present

## 2023-04-13 DIAGNOSIS — F4322 Adjustment disorder with anxiety: Secondary | ICD-10-CM | POA: Diagnosis not present

## 2023-04-20 DIAGNOSIS — F4322 Adjustment disorder with anxiety: Secondary | ICD-10-CM | POA: Diagnosis not present

## 2023-04-27 DIAGNOSIS — F4322 Adjustment disorder with anxiety: Secondary | ICD-10-CM | POA: Diagnosis not present

## 2023-05-04 DIAGNOSIS — F4322 Adjustment disorder with anxiety: Secondary | ICD-10-CM | POA: Diagnosis not present

## 2023-05-11 DIAGNOSIS — F4322 Adjustment disorder with anxiety: Secondary | ICD-10-CM | POA: Diagnosis not present

## 2023-05-18 DIAGNOSIS — F4322 Adjustment disorder with anxiety: Secondary | ICD-10-CM | POA: Diagnosis not present

## 2023-05-25 DIAGNOSIS — F4322 Adjustment disorder with anxiety: Secondary | ICD-10-CM | POA: Diagnosis not present

## 2023-06-01 DIAGNOSIS — F4322 Adjustment disorder with anxiety: Secondary | ICD-10-CM | POA: Diagnosis not present

## 2023-06-15 DIAGNOSIS — F4322 Adjustment disorder with anxiety: Secondary | ICD-10-CM | POA: Diagnosis not present

## 2023-06-22 DIAGNOSIS — F4322 Adjustment disorder with anxiety: Secondary | ICD-10-CM | POA: Diagnosis not present

## 2023-06-29 DIAGNOSIS — F4322 Adjustment disorder with anxiety: Secondary | ICD-10-CM | POA: Diagnosis not present

## 2023-07-06 DIAGNOSIS — F4322 Adjustment disorder with anxiety: Secondary | ICD-10-CM | POA: Diagnosis not present

## 2023-07-13 DIAGNOSIS — F4322 Adjustment disorder with anxiety: Secondary | ICD-10-CM | POA: Diagnosis not present

## 2023-07-23 DIAGNOSIS — F4322 Adjustment disorder with anxiety: Secondary | ICD-10-CM | POA: Diagnosis not present

## 2023-07-27 DIAGNOSIS — F4322 Adjustment disorder with anxiety: Secondary | ICD-10-CM | POA: Diagnosis not present

## 2023-08-03 DIAGNOSIS — F4322 Adjustment disorder with anxiety: Secondary | ICD-10-CM | POA: Diagnosis not present

## 2023-08-13 DIAGNOSIS — F4322 Adjustment disorder with anxiety: Secondary | ICD-10-CM | POA: Diagnosis not present

## 2023-08-20 DIAGNOSIS — F4323 Adjustment disorder with mixed anxiety and depressed mood: Secondary | ICD-10-CM | POA: Diagnosis not present

## 2023-08-24 DIAGNOSIS — F4322 Adjustment disorder with anxiety: Secondary | ICD-10-CM | POA: Diagnosis not present

## 2023-08-31 DIAGNOSIS — F4322 Adjustment disorder with anxiety: Secondary | ICD-10-CM | POA: Diagnosis not present

## 2023-09-07 DIAGNOSIS — F4322 Adjustment disorder with anxiety: Secondary | ICD-10-CM | POA: Diagnosis not present

## 2023-09-14 DIAGNOSIS — F4322 Adjustment disorder with anxiety: Secondary | ICD-10-CM | POA: Diagnosis not present

## 2023-09-16 DIAGNOSIS — Z01419 Encounter for gynecological examination (general) (routine) without abnormal findings: Secondary | ICD-10-CM | POA: Diagnosis not present

## 2023-09-16 DIAGNOSIS — Z1389 Encounter for screening for other disorder: Secondary | ICD-10-CM | POA: Diagnosis not present

## 2023-09-16 DIAGNOSIS — Z309 Encounter for contraceptive management, unspecified: Secondary | ICD-10-CM | POA: Diagnosis not present

## 2023-09-16 DIAGNOSIS — Z124 Encounter for screening for malignant neoplasm of cervix: Secondary | ICD-10-CM | POA: Diagnosis not present

## 2023-09-16 DIAGNOSIS — Z30430 Encounter for insertion of intrauterine contraceptive device: Secondary | ICD-10-CM | POA: Diagnosis not present

## 2023-09-16 DIAGNOSIS — Z113 Encounter for screening for infections with a predominantly sexual mode of transmission: Secondary | ICD-10-CM | POA: Diagnosis not present

## 2023-09-16 DIAGNOSIS — Z1331 Encounter for screening for depression: Secondary | ICD-10-CM | POA: Diagnosis not present

## 2023-09-21 DIAGNOSIS — F4322 Adjustment disorder with anxiety: Secondary | ICD-10-CM | POA: Diagnosis not present

## 2023-09-28 DIAGNOSIS — F4322 Adjustment disorder with anxiety: Secondary | ICD-10-CM | POA: Diagnosis not present

## 2023-09-30 DIAGNOSIS — Z1389 Encounter for screening for other disorder: Secondary | ICD-10-CM | POA: Diagnosis not present

## 2023-09-30 DIAGNOSIS — Z309 Encounter for contraceptive management, unspecified: Secondary | ICD-10-CM | POA: Diagnosis not present

## 2023-09-30 DIAGNOSIS — B977 Papillomavirus as the cause of diseases classified elsewhere: Secondary | ICD-10-CM | POA: Diagnosis not present

## 2023-10-05 DIAGNOSIS — F4322 Adjustment disorder with anxiety: Secondary | ICD-10-CM | POA: Diagnosis not present

## 2023-10-12 DIAGNOSIS — F4322 Adjustment disorder with anxiety: Secondary | ICD-10-CM | POA: Diagnosis not present

## 2023-10-19 DIAGNOSIS — F4322 Adjustment disorder with anxiety: Secondary | ICD-10-CM | POA: Diagnosis not present

## 2023-10-26 DIAGNOSIS — F4322 Adjustment disorder with anxiety: Secondary | ICD-10-CM | POA: Diagnosis not present

## 2023-11-02 DIAGNOSIS — F4323 Adjustment disorder with mixed anxiety and depressed mood: Secondary | ICD-10-CM | POA: Diagnosis not present

## 2023-11-12 DIAGNOSIS — F4322 Adjustment disorder with anxiety: Secondary | ICD-10-CM | POA: Diagnosis not present

## 2023-11-16 DIAGNOSIS — F4322 Adjustment disorder with anxiety: Secondary | ICD-10-CM | POA: Diagnosis not present

## 2023-11-26 DIAGNOSIS — F4322 Adjustment disorder with anxiety: Secondary | ICD-10-CM | POA: Diagnosis not present

## 2023-11-30 DIAGNOSIS — F4322 Adjustment disorder with anxiety: Secondary | ICD-10-CM | POA: Diagnosis not present

## 2023-12-07 DIAGNOSIS — F4322 Adjustment disorder with anxiety: Secondary | ICD-10-CM | POA: Diagnosis not present

## 2023-12-14 DIAGNOSIS — F4322 Adjustment disorder with anxiety: Secondary | ICD-10-CM | POA: Diagnosis not present

## 2023-12-21 DIAGNOSIS — F4322 Adjustment disorder with anxiety: Secondary | ICD-10-CM | POA: Diagnosis not present

## 2023-12-30 DIAGNOSIS — F4322 Adjustment disorder with anxiety: Secondary | ICD-10-CM | POA: Diagnosis not present

## 2024-01-06 DIAGNOSIS — F4322 Adjustment disorder with anxiety: Secondary | ICD-10-CM | POA: Diagnosis not present
